# Patient Record
Sex: Female | Born: 1960 | Race: White | Hispanic: No | Marital: Married | Smoking: Never smoker
Health system: Southern US, Community
[De-identification: ages and names within clinical notes are randomized; demographics above are authoritative.]

## PROBLEM LIST (undated history)

## (undated) DIAGNOSIS — T7840XA Allergy, unspecified, initial encounter: Secondary | ICD-10-CM

## (undated) DIAGNOSIS — F419 Anxiety disorder, unspecified: Secondary | ICD-10-CM

## (undated) DIAGNOSIS — G43909 Migraine, unspecified, not intractable, without status migrainosus: Secondary | ICD-10-CM

## (undated) HISTORY — PX: BREAST CYST EXCISION: SHX579

## (undated) HISTORY — PX: BREAST EXCISIONAL BIOPSY: SUR124

## (undated) HISTORY — DX: Anxiety disorder, unspecified: F41.9

## (undated) HISTORY — DX: Migraine, unspecified, not intractable, without status migrainosus: G43.909

## (undated) HISTORY — DX: Allergy, unspecified, initial encounter: T78.40XA

---

## 2000-09-30 HISTORY — PX: ABDOMINAL HYSTERECTOMY: SHX81

## 2008-01-31 HISTORY — PX: CHOLECYSTECTOMY: SHX55

## 2012-09-30 LAB — HM MAMMOGRAPHY: HM MAMMO: NORMAL

## 2012-09-30 LAB — HM PAP SMEAR: HM Pap smear: NORMAL

## 2012-09-30 LAB — HM COLONOSCOPY: HM COLON: NORMAL

## 2012-09-30 LAB — HM DEXA SCAN

## 2014-03-18 ENCOUNTER — Encounter: Payer: Self-pay | Admitting: *Deleted

## 2014-03-18 ENCOUNTER — Telehealth: Payer: Self-pay | Admitting: *Deleted

## 2014-03-18 NOTE — Telephone Encounter (Signed)
Pre-Visit Call:   Reviewed allergies, medications, health history, and health maintenance with patient and made changes as appropriate.   Family medical history not fully known because patient was in foster care.  Father passed last year due to dementia.   [phone] [place] [place] - [address] [facility] [address]  Health maintenance- patient has completed pre-visit form, and states that she will bring this info to office visit.    Flu- declines Td- declines Patient states that she would not like any shots, she passes out.    Concerns: None!  Just wants to get started with a new MD

## 2014-03-19 ENCOUNTER — Ambulatory Visit (INDEPENDENT_AMBULATORY_CARE_PROVIDER_SITE_OTHER): Admitting: Family Medicine

## 2014-03-19 ENCOUNTER — Encounter: Payer: Self-pay | Admitting: Family Medicine

## 2014-03-19 VITALS — BP 130/80 | HR 79 | Temp 98.0°F | Resp 16 | Ht 63.0 in | Wt 220.2 lb

## 2014-03-19 DIAGNOSIS — E559 Vitamin D deficiency, unspecified: Secondary | ICD-10-CM

## 2014-03-19 DIAGNOSIS — E669 Obesity, unspecified: Secondary | ICD-10-CM

## 2014-03-19 DIAGNOSIS — F411 Generalized anxiety disorder: Secondary | ICD-10-CM | POA: Insufficient documentation

## 2014-03-19 NOTE — Assessment & Plan Note (Signed)
New to provider, chronic for pt.  Admits she does not take meds regularly.  Due for repeat labs.  Will replete prn.

## 2014-03-19 NOTE — Assessment & Plan Note (Signed)
New to provider, ongoing for pt.  sxs are well controlled on Lexapro daily.  Will continue to follow and modify meds if needed

## 2014-03-19 NOTE — Assessment & Plan Note (Signed)
New to provider.  Pt is not getting regular exercise nor following any particular diet.  Stressed need for regular exercise and low carb diet.  Encouraged regular, aerobic activity for 30 minutes at least 4x/week and monitoring caloric intake w/ the help of MyFitnessPal app.  Check labs to risk stratify.  Pt expressed understanding and is in agreement w/ plan.

## 2014-03-19 NOTE — Progress Notes (Signed)
   Subjective:    Patient ID: Tiffany Mcgee, female    DOB: [birth year], 54 y.o.   MRN: [id number]  HPI New to establish.  [place]  Anxiety- chronic problem, currently on Lexapro 20 mg daily.  Pt feels anxiety is pretty well controlled.  No longer 'waking up scared to death or going to bed scared to death'.  Hx of panic attacks, has not had one recently.  Vit D deficiency- chronic problem, has prescription for 50,000 units weekly but 'i'm really random about it'.  Due for repeat labs.  Obesity- BMI of 39.  Pt reports weight gain since starting SSRIs.  Not exercising regularly.  Previously was walking 2-3 miles 3-4x/week.  Not following diet plan.  No CP, SOB, HAs, visual changes, abd pain, N/V, edema.   Review of Systems For ROS see HPI     Objective:   Physical Exam  Constitutional: She is oriented to person, place, and time. She appears well-developed and well-nourished. No distress.  obese  HENT:  Head: Normocephalic and atraumatic.  Eyes: Conjunctivae and EOM are normal. Pupils are equal, round, and reactive to light.  Neck: Normal range of motion. Neck supple. No thyromegaly present.  Cardiovascular: Normal rate, regular rhythm, normal heart sounds and intact distal pulses.   No murmur heard. Pulmonary/Chest: Effort normal and breath sounds normal. No respiratory distress.  Abdominal: Soft. She exhibits no distension. There is no tenderness.  Musculoskeletal: She exhibits no edema.  Lymphadenopathy:    She has no cervical adenopathy.  Neurological: She is alert and oriented to person, place, and time.  Skin: Skin is warm and dry.  Psychiatric: She has a normal mood and affect. Her behavior is normal.  Vitals reviewed.         Assessment & Plan:

## 2014-03-19 NOTE — Patient Instructions (Signed)
Schedule your complete physical in 6 months We'll notify you of your lab results and make any changes if needed Try and make healthy food choices and get regular exercise Call with any questions or concerns Welcome!  We're glad to have you!!

## 2014-03-19 NOTE — Progress Notes (Signed)
Pre visit review using our clinic review tool, if applicable. No additional management support is needed unless otherwise documented below in the visit note. 

## 2014-03-20 LAB — CBC WITH DIFFERENTIAL/PLATELET
BASOS ABS: 0.1 10*3/uL (ref 0.0–0.1)
Basophils Relative: 0.7 % (ref 0.0–3.0)
EOS ABS: 0.3 10*3/uL (ref 0.0–0.7)
Eosinophils Relative: 3.7 % (ref 0.0–5.0)
HEMATOCRIT: 41.2 % (ref 36.0–46.0)
Hemoglobin: 13.8 g/dL (ref 12.0–15.0)
LYMPHS ABS: 2.2 10*3/uL (ref 0.7–4.0)
Lymphocytes Relative: 30.6 % (ref 12.0–46.0)
MCHC: 33.7 g/dL (ref 30.0–36.0)
MCV: 90.6 fl (ref 78.0–100.0)
MONO ABS: 0.7 10*3/uL (ref 0.1–1.0)
MONOS PCT: 9.2 % (ref 3.0–12.0)
NEUTROS ABS: 4 10*3/uL (ref 1.4–7.7)
Neutrophils Relative %: 55.8 % (ref 43.0–77.0)
PLATELETS: 269 10*3/uL (ref 150.0–400.0)
RBC: 4.54 Mil/uL (ref 3.87–5.11)
RDW: 12.8 % (ref 11.5–15.5)
WBC: 7.1 10*3/uL (ref 4.0–10.5)

## 2014-03-20 LAB — LIPID PANEL
CHOL/HDL RATIO: 5
Cholesterol: 207 mg/dL — ABNORMAL HIGH (ref 0–200)
HDL: 43.7 mg/dL (ref >39.00)
LDL CALC: 140 mg/dL — AB (ref 0–99)
NONHDL: 163.3
Triglycerides: 119 mg/dL (ref 0.0–149.0)
VLDL: 23.8 mg/dL (ref 0.0–40.0)

## 2014-03-20 LAB — BASIC METABOLIC PANEL
BUN: 15 mg/dL (ref 6–23)
CALCIUM: 9.5 mg/dL (ref 8.4–10.5)
CO2: 27 meq/L (ref 19–32)
Chloride: 101 mEq/L (ref 96–112)
Creatinine, Ser: 0.82 mg/dL (ref 0.40–1.20)
GFR: 77.85 mL/min (ref >60.00)
Glucose, Bld: 82 mg/dL (ref 70–99)
POTASSIUM: 4.2 meq/L (ref 3.5–5.1)
SODIUM: 136 meq/L (ref 135–145)

## 2014-03-20 LAB — HEPATIC FUNCTION PANEL
ALT: 30 U/L (ref 0–35)
AST: 27 U/L (ref 0–37)
Albumin: 3.9 g/dL (ref 3.5–5.2)
Alkaline Phosphatase: 89 U/L (ref 39–117)
BILIRUBIN DIRECT: 0.1 mg/dL (ref 0.0–0.3)
TOTAL PROTEIN: 7.4 g/dL (ref 6.0–8.3)
Total Bilirubin: 0.5 mg/dL (ref 0.2–1.2)

## 2014-03-20 LAB — HEMOGLOBIN A1C: Hgb A1c MFr Bld: 5.8 % (ref 4.6–6.5)

## 2014-03-20 LAB — TSH: TSH: 2.79 u[IU]/mL (ref 0.35–4.50)

## 2014-03-20 LAB — VITAMIN D 25 HYDROXY (VIT D DEFICIENCY, FRACTURES): VITD: 15.77 ng/mL — AB (ref 30.00–100.00)

## 2014-03-23 ENCOUNTER — Other Ambulatory Visit: Payer: Self-pay | Admitting: General Practice

## 2014-03-23 MED ORDER — VITAMIN D (ERGOCALCIFEROL) 1.25 MG (50000 UNIT) PO CAPS: 50000.0000 [IU] | ORAL_CAPSULE | ORAL | Status: DC

## 2014-05-26 ENCOUNTER — Other Ambulatory Visit: Payer: Self-pay | Admitting: Family Medicine

## 2014-05-26 NOTE — Telephone Encounter (Signed)
Med denied, pt needs to continue OTC supplement.

## 2014-09-04 ENCOUNTER — Telehealth: Payer: Self-pay | Admitting: Family Medicine

## 2014-09-04 NOTE — Telephone Encounter (Signed)
Pt rescheduled from 09/28/14 8:00am cpe to 10/01/14 11:00am, preapproved per Jess T.

## 2014-09-04 NOTE — Telephone Encounter (Signed)
Noted  

## 2014-09-16 ENCOUNTER — Telehealth: Payer: Self-pay | Admitting: Family Medicine

## 2014-09-16 NOTE — Telephone Encounter (Signed)
pre visit letter mailed 09/10/14 °

## 2014-09-28 ENCOUNTER — Encounter: Admitting: Family Medicine

## 2014-09-30 ENCOUNTER — Telehealth: Payer: Self-pay | Admitting: *Deleted

## 2014-09-30 ENCOUNTER — Encounter: Payer: Self-pay | Admitting: *Deleted

## 2014-09-30 NOTE — Telephone Encounter (Signed)
Pre-Visit Call completed with patient and chart updated.   Pre-Visit Info documented in Specialty Comments under [employer].    

## 2014-10-01 ENCOUNTER — Ambulatory Visit (INDEPENDENT_AMBULATORY_CARE_PROVIDER_SITE_OTHER): Admitting: Family Medicine

## 2014-10-01 ENCOUNTER — Encounter: Payer: Self-pay | Admitting: Family Medicine

## 2014-10-01 VITALS — BP 126/80 | HR 83 | Temp 98.2°F | Resp 18 | Ht 63.5 in | Wt 219.8 lb

## 2014-10-01 DIAGNOSIS — E669 Obesity, unspecified: Secondary | ICD-10-CM

## 2014-10-01 DIAGNOSIS — Z0000 Encounter for general adult medical examination without abnormal findings: Secondary | ICD-10-CM | POA: Diagnosis not present

## 2014-10-01 DIAGNOSIS — Z1231 Encounter for screening mammogram for malignant neoplasm of breast: Secondary | ICD-10-CM

## 2014-10-01 LAB — CBC WITH DIFFERENTIAL/PLATELET
BASOS ABS: 0 10*3/uL (ref 0.0–0.1)
Basophils Relative: 0.5 % (ref 0.0–3.0)
EOS ABS: 0.2 10*3/uL (ref 0.0–0.7)
Eosinophils Relative: 3.9 % (ref 0.0–5.0)
HEMATOCRIT: 42.7 % (ref 36.0–46.0)
HEMOGLOBIN: 14.2 g/dL (ref 12.0–15.0)
LYMPHS PCT: 27.5 % (ref 12.0–46.0)
Lymphs Abs: 1.7 10*3/uL (ref 0.7–4.0)
MCHC: 33.4 g/dL (ref 30.0–36.0)
MCV: 92.3 fl (ref 78.0–100.0)
MONO ABS: 0.5 10*3/uL (ref 0.1–1.0)
Monocytes Relative: 7.5 % (ref 3.0–12.0)
NEUTROS ABS: 3.8 10*3/uL (ref 1.4–7.7)
Neutrophils Relative %: 60.6 % (ref 43.0–77.0)
PLATELETS: 296 10*3/uL (ref 150.0–400.0)
RBC: 4.62 Mil/uL (ref 3.87–5.11)
RDW: 13.2 % (ref 11.5–15.5)
WBC: 6.3 10*3/uL (ref 4.0–10.5)

## 2014-10-01 LAB — BASIC METABOLIC PANEL
BUN: 17 mg/dL (ref 6–23)
CHLORIDE: 103 meq/L (ref 96–112)
CO2: 30 mEq/L (ref 19–32)
CREATININE: 0.83 mg/dL (ref 0.40–1.20)
Calcium: 9.6 mg/dL (ref 8.4–10.5)
GFR: 76.61 mL/min (ref >60.00)
Glucose, Bld: 97 mg/dL (ref 70–99)
POTASSIUM: 4.7 meq/L (ref 3.5–5.1)
Sodium: 139 mEq/L (ref 135–145)

## 2014-10-01 LAB — LIPID PANEL
CHOL/HDL RATIO: 4
Cholesterol: 188 mg/dL (ref 0–200)
HDL: 42.7 mg/dL (ref >39.00)
LDL Cholesterol: 128 mg/dL — ABNORMAL HIGH (ref 0–99)
NONHDL: 145.16
Triglycerides: 87 mg/dL (ref 0.0–149.0)
VLDL: 17.4 mg/dL (ref 0.0–40.0)

## 2014-10-01 LAB — HEPATIC FUNCTION PANEL
ALT: 19 U/L (ref 0–35)
AST: 20 U/L (ref 0–37)
Albumin: 4.1 g/dL (ref 3.5–5.2)
Alkaline Phosphatase: 73 U/L (ref 39–117)
Bilirubin, Direct: 0.1 mg/dL (ref 0.0–0.3)
TOTAL PROTEIN: 7.8 g/dL (ref 6.0–8.3)
Total Bilirubin: 0.5 mg/dL (ref 0.2–1.2)

## 2014-10-01 LAB — TSH: TSH: 1.72 u[IU]/mL (ref 0.35–4.50)

## 2014-10-01 LAB — VITAMIN D 25 HYDROXY (VIT D DEFICIENCY, FRACTURES): VITD: 44.02 ng/mL (ref 30.00–100.00)

## 2014-10-01 NOTE — Progress Notes (Signed)
   Subjective:    Patient ID: Tiffany Mcgee, female    DOB: 07/08/1960, 54 y.o.   MRN: [id number]  HPI CPE- UTD on pap, due next year.  Due for mammo.  UTD on colonoscopy- due 2024.     Review of Systems Patient reports no vision/ hearing changes, adenopathy,fever, weight change,  persistant/recurrent hoarseness , swallowing issues, chest pain, palpitations, edema, persistant/recurrent cough, hemoptysis, dyspnea (rest/exertional/paroxysmal nocturnal), gastrointestinal bleeding (melena, rectal bleeding), abdominal pain, significant heartburn, bowel changes, GU symptoms (dysuria, hematuria, incontinence), Gyn symptoms (abnormal  bleeding, pain),  syncope, focal weakness, memory loss, numbness & tingling, skin/hair/nail changes, abnormal bruising or bleeding, anxiety, or depression.    + obesity- has decided she needs to lose weight and has lost 3 lbs since Saturday.  Pt is walking regularly.    Objective:   Physical Exam General Appearance:    Alert, cooperative, no distress, appears stated age  Head:    Normocephalic, without obvious abnormality, atraumatic  Eyes:    PERRL, conjunctiva/corneas clear, EOM's intact, fundi    benign, both eyes  Ears:    Normal TM's and external ear canals, both ears  Nose:   Nares normal, septum midline, mucosa normal, no drainage    or sinus tenderness  Throat:   Lips, mucosa, and tongue normal; teeth and gums normal  Neck:   Supple, symmetrical, trachea midline, no adenopathy;    Thyroid: no enlargement/tenderness/nodules  Back:     Symmetric, no curvature, ROM normal, no CVA tenderness  Lungs:     Clear to auscultation bilaterally, respirations unlabored  Chest Wall:    No tenderness or deformity   Heart:    Regular rate and rhythm, S1 and S2 normal, no murmur, rub   or gallop  Breast Exam:    Normal breast exam  Abdomen:     Soft, non-tender, bowel sounds active all four quadrants,    no masses, no organomegaly  Genitalia:    Deferred  Rectal:      Extremities:   Extremities normal, atraumatic, no cyanosis or edema  Pulses:   2+ and symmetric all extremities  Skin:   Skin color, texture, turgor normal, no rashes or lesions  Lymph nodes:   Cervical, supraclavicular, and axillary nodes normal  Neurologic:   CNII-XII intact, normal strength, sensation and reflexes    throughout          Assessment & Plan:

## 2014-10-01 NOTE — Patient Instructions (Signed)
Follow up in 6-8 weeks to recheck weight loss progress We'll notify you of your lab results and make any changes if needed Keep up the good work on healthy diet and regular exercise- you can totally do this! If you need help in your weight loss journey- call [facility] at [phone] We'll call you with your mammogram appt Resume your Calcium + Vit D daily Add daily Claritin, Allegra, Zyrtec for the allergy component Call with any questions or concerns Happy Labor Day!!!

## 2014-10-01 NOTE — Progress Notes (Signed)
Pre visit review using our clinic review tool, if applicable. No additional management support is needed unless otherwise documented below in the visit note. 

## 2014-10-01 NOTE — Assessment & Plan Note (Signed)
Chronic problem for pt.  She reports that she has committed to making changes in diet and exercise.  Discussed medically supervised weight loss through [facility].  Applauded her decision to make changes and will follow.

## 2014-10-01 NOTE — Assessment & Plan Note (Signed)
Pt's PE WNL w/ exception of obesity.  Due for mammo- order entered.  UTD on pap and colonoscopy.  Check labs.  Anticipatory guidance provided.

## 2014-10-09 ENCOUNTER — Ambulatory Visit (HOSPITAL_BASED_OUTPATIENT_CLINIC_OR_DEPARTMENT_OTHER)
Admission: RE | Admit: 2014-10-09 | Discharge: 2014-10-09 | Disposition: A | Discharge status: Home / Self Care | Source: Ambulatory Visit | Attending: Family Medicine | Admitting: Family Medicine

## 2014-10-09 DIAGNOSIS — Z1231 Encounter for screening mammogram for malignant neoplasm of breast: Secondary | ICD-10-CM | POA: Diagnosis not present

## 2014-10-09 DIAGNOSIS — R928 Other abnormal and inconclusive findings on diagnostic imaging of breast: Secondary | ICD-10-CM | POA: Diagnosis not present

## 2014-10-20 ENCOUNTER — Other Ambulatory Visit: Payer: Self-pay | Admitting: Family Medicine

## 2014-10-20 DIAGNOSIS — R928 Other abnormal and inconclusive findings on diagnostic imaging of breast: Secondary | ICD-10-CM

## 2014-10-26 ENCOUNTER — Encounter

## 2014-10-26 ENCOUNTER — Other Ambulatory Visit

## 2014-10-26 ENCOUNTER — Ambulatory Visit
Admission: RE | Admit: 2014-10-26 | Discharge: 2014-10-26 | Disposition: A | Discharge status: Home / Self Care | Source: Ambulatory Visit | Attending: Family Medicine | Admitting: Family Medicine

## 2014-10-26 DIAGNOSIS — R928 Other abnormal and inconclusive findings on diagnostic imaging of breast: Secondary | ICD-10-CM

## 2014-10-29 ENCOUNTER — Other Ambulatory Visit: Payer: Self-pay | Admitting: Family Medicine

## 2014-10-29 DIAGNOSIS — N63 Unspecified lump in unspecified breast: Secondary | ICD-10-CM

## 2014-11-23 ENCOUNTER — Ambulatory Visit: Admitting: Family Medicine

## 2014-11-23 ENCOUNTER — Telehealth: Payer: Self-pay | Admitting: Family Medicine

## 2014-11-24 NOTE — Telephone Encounter (Signed)
Pt was no show 11/23/14 9:30am for follow up appt, pt has not rescheduled, charge or no charge?

## 2014-11-24 NOTE — Telephone Encounter (Signed)
No fee as I was not in office on 10/24

## 2015-04-26 ENCOUNTER — Ambulatory Visit
Admission: RE | Admit: 2015-04-26 | Discharge: 2015-04-26 | Disposition: A | Discharge status: Home / Self Care | Source: Ambulatory Visit | Attending: Family Medicine | Admitting: Family Medicine

## 2015-04-26 DIAGNOSIS — N63 Unspecified lump in unspecified breast: Secondary | ICD-10-CM

## 2015-11-03 ENCOUNTER — Other Ambulatory Visit: Payer: Self-pay | Admitting: Family Medicine

## 2015-11-03 ENCOUNTER — Other Ambulatory Visit: Payer: Self-pay | Admitting: Internal Medicine

## 2015-11-03 DIAGNOSIS — N6002 Solitary cyst of left breast: Secondary | ICD-10-CM

## 2015-11-09 ENCOUNTER — Ambulatory Visit
Admission: RE | Admit: 2015-11-09 | Discharge: 2015-11-09 | Disposition: A | Discharge status: Home / Self Care | Source: Ambulatory Visit | Attending: Internal Medicine | Admitting: Internal Medicine

## 2015-11-09 DIAGNOSIS — N6002 Solitary cyst of left breast: Secondary | ICD-10-CM

## 2016-10-11 ENCOUNTER — Other Ambulatory Visit: Payer: Self-pay | Admitting: Internal Medicine

## 2016-10-11 DIAGNOSIS — N6320 Unspecified lump in the left breast, unspecified quadrant: Secondary | ICD-10-CM

## 2016-11-10 ENCOUNTER — Encounter

## 2016-11-10 ENCOUNTER — Other Ambulatory Visit

## 2016-11-17 ENCOUNTER — Ambulatory Visit

## 2016-11-17 ENCOUNTER — Ambulatory Visit
Admission: RE | Admit: 2016-11-17 | Discharge: 2016-11-17 | Disposition: A | Discharge status: Home / Self Care | Source: Ambulatory Visit | Attending: Internal Medicine | Admitting: Internal Medicine

## 2016-11-17 DIAGNOSIS — N6320 Unspecified lump in the left breast, unspecified quadrant: Secondary | ICD-10-CM

## 2017-01-08 IMAGING — MG 2D DIGITAL DIAGNOSTIC BILATERAL MAMMOGRAM WITH CAD AND ADJUNCT T
8 of 12 series · 8 of 28 positions shown · non-contrast
Comparison: Previous exam(s).

CLINICAL DATA: Followup for probably benign left breast mass.

EXAM:
2D DIGITAL DIAGNOSTIC BILATERAL MAMMOGRAM WITH CAD AND ADJUNCT TOMO
LEFT BREAST ULTRASOUND

[R MLO synth-2D]
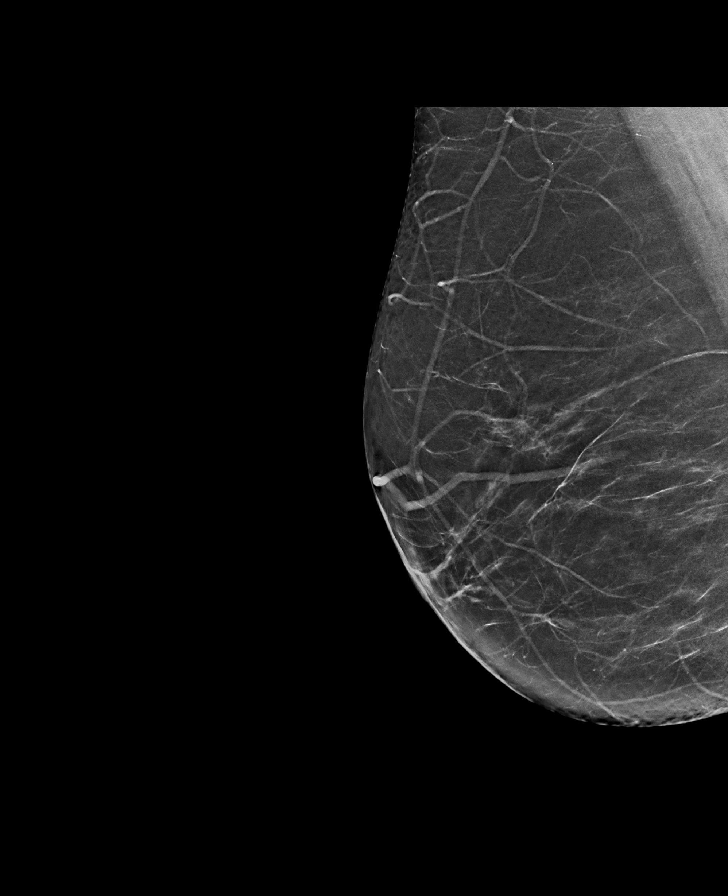

[L CC]
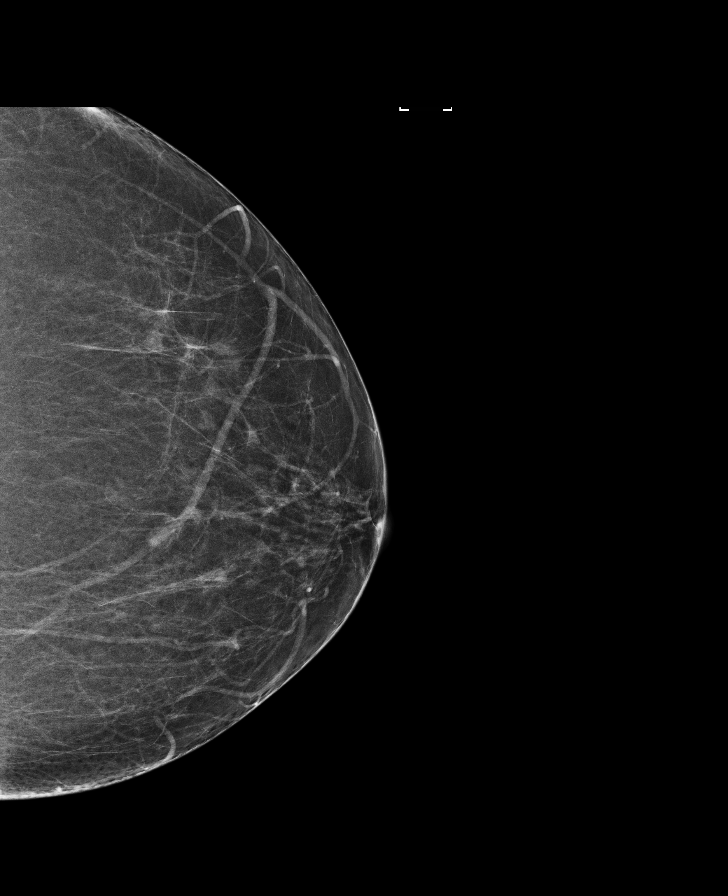

[L CC synth-2D]
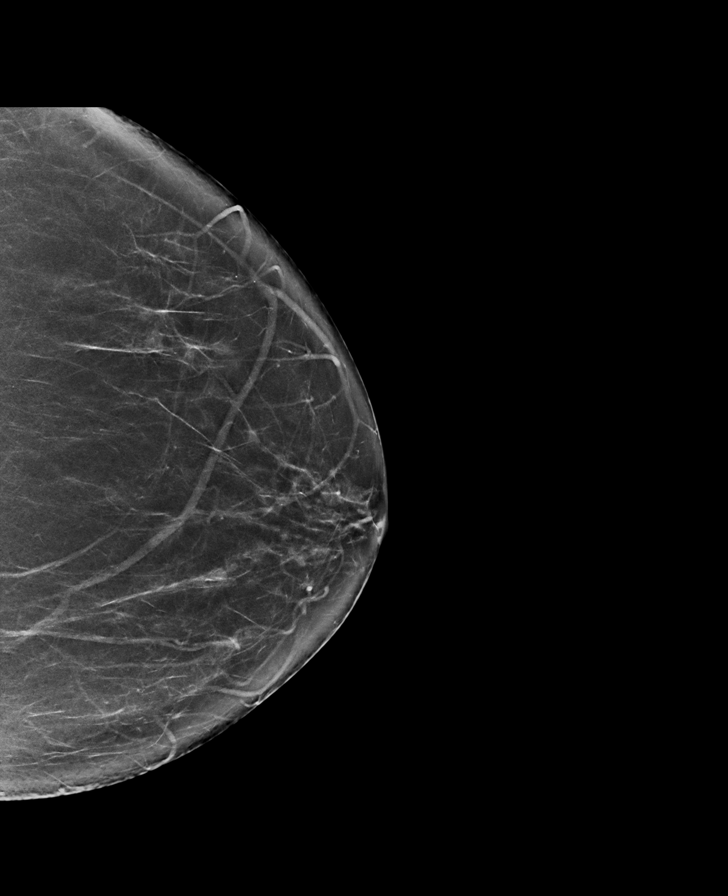

[L MLO synth-2D]
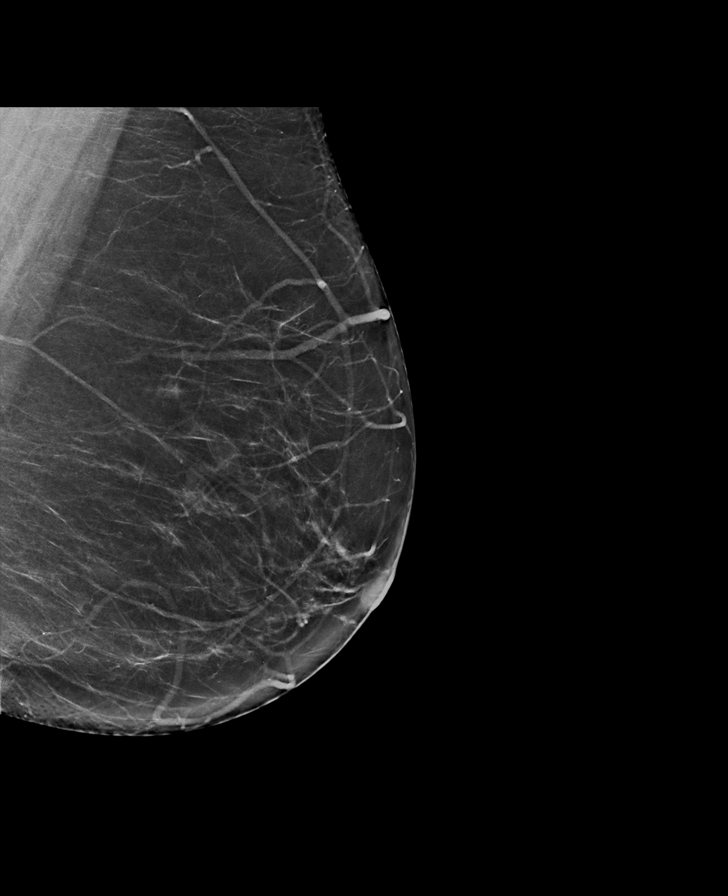

[R CC synth-2D]
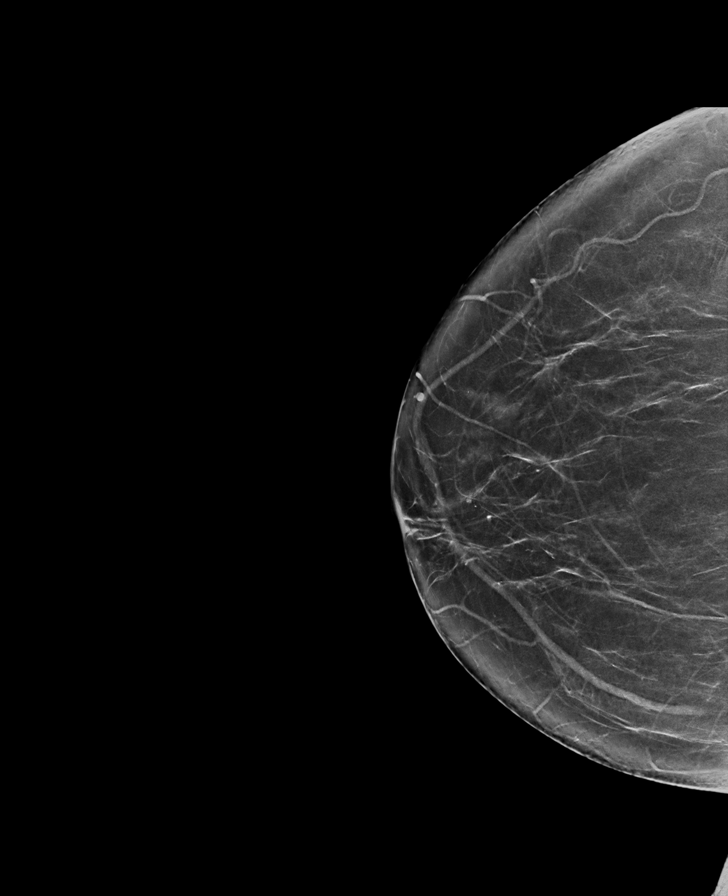

[R MLO]
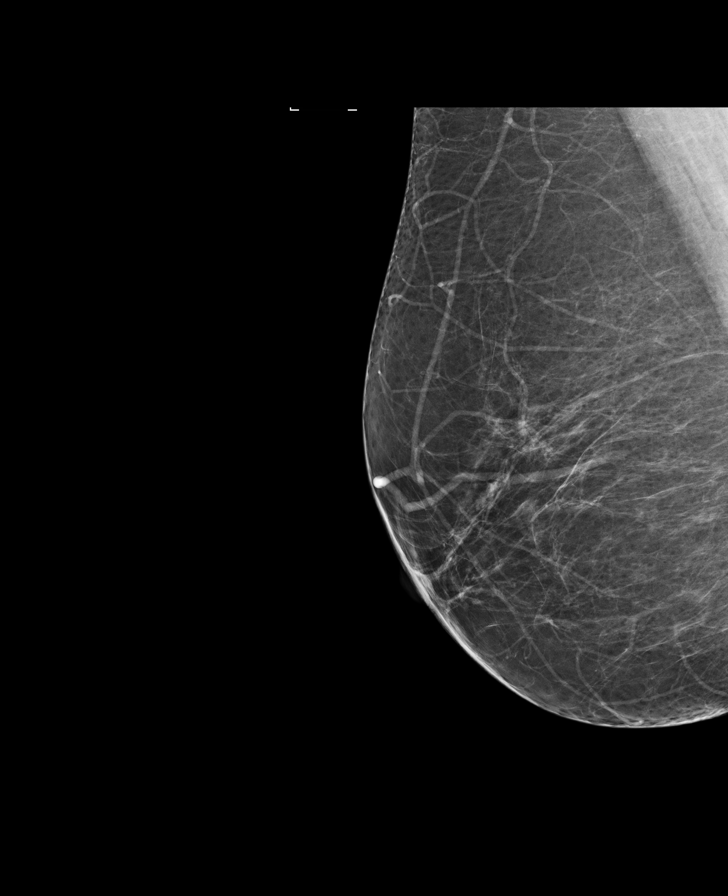

[L MLO]
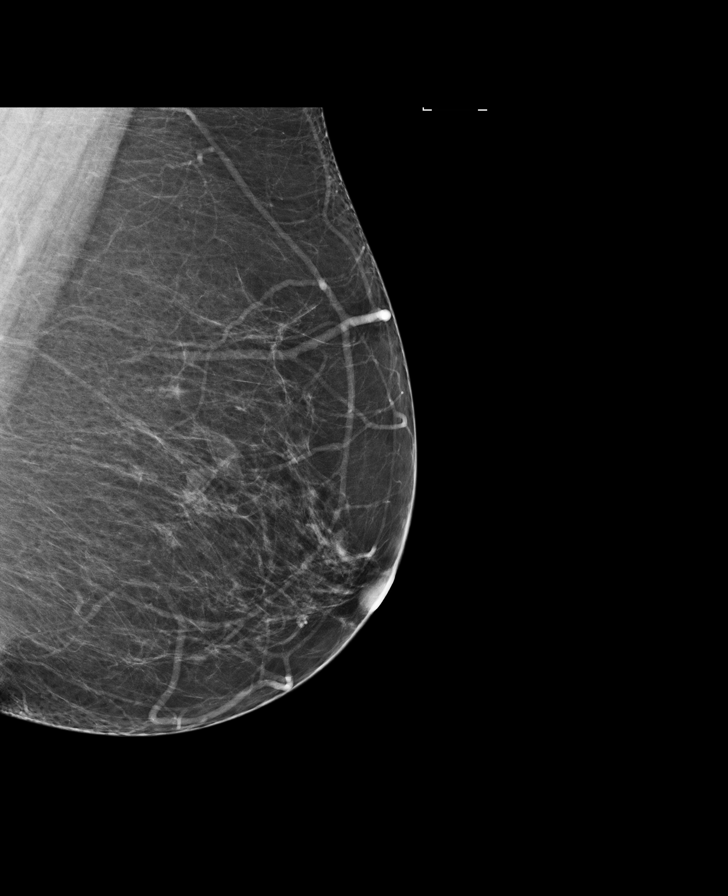

[R CC]
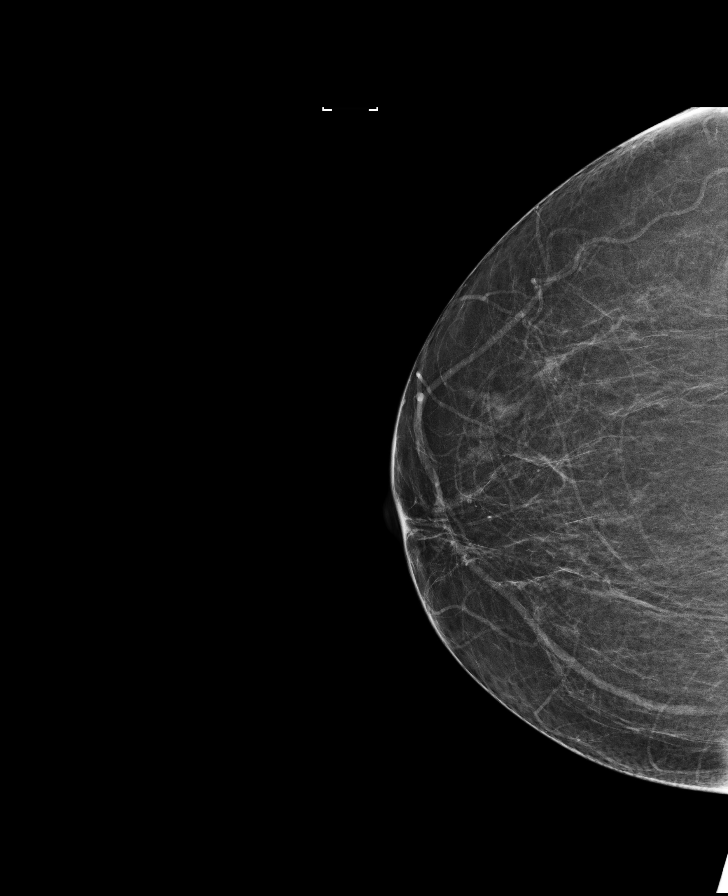

[8 of 28 positions shown; findings below may reference images not displayed]

ACR Breast Density Category b: There are scattered areas of
fibroglandular density.
FINDINGS: No suspicious masses or calcifications are seen in either breast.
The oval circumscribed mass in the central to slightly inner left
breast overall appears unchanged. There is no mammographic evidence
of malignancy in the left breast.

Mammographic images were processed with CAD.

Targeted ultrasound of the left breast was performed demonstrating
an oval well-circumscribed near anechoic mass with posterior
acoustic enhancement consistent with a cyst at [DATE] 4 cm from
nipple measuring 0.6 x 0.3 x 0.6 cm. This is overall unchanged in
size in appearance when compared to the prior exam given differences
in imaging and measurement technique. This likely corresponds with
the mass seen at mammography.
IMPRESSION: Unchanged benign appearing mass in the upper inner left breast.
There is no mammographic evidence of malignancy in either breast.

RECOMMENDATION:
Bilateral diagnostic mammography in 12 months with left breast
ultrasound which will demonstrate 2 years of stability of the
probably benign of left breast mass.

I have discussed the findings and recommendations with the patient.
Results were also provided in writing at the conclusion of the
visit. If applicable, a reminder letter will be sent to the patient
regarding the next appointment.

BI-RADS CATEGORY  3: Probably benign.

## 2018-01-17 IMAGING — MG 2D DIGITAL DIAGNOSTIC BILATERAL MAMMOGRAM WITH CAD AND ADJUNCT T
3 series · 4 of 7 positions shown · non-contrast
Comparison: Previous exam(s).

CLINICAL DATA: Two year follow-up of probably benign left breast
mass.

EXAM:
2D DIGITAL DIAGNOSTIC BILATERAL MAMMOGRAM WITH CAD AND ADJUNCT TOMO

[L MLO synth-2D]
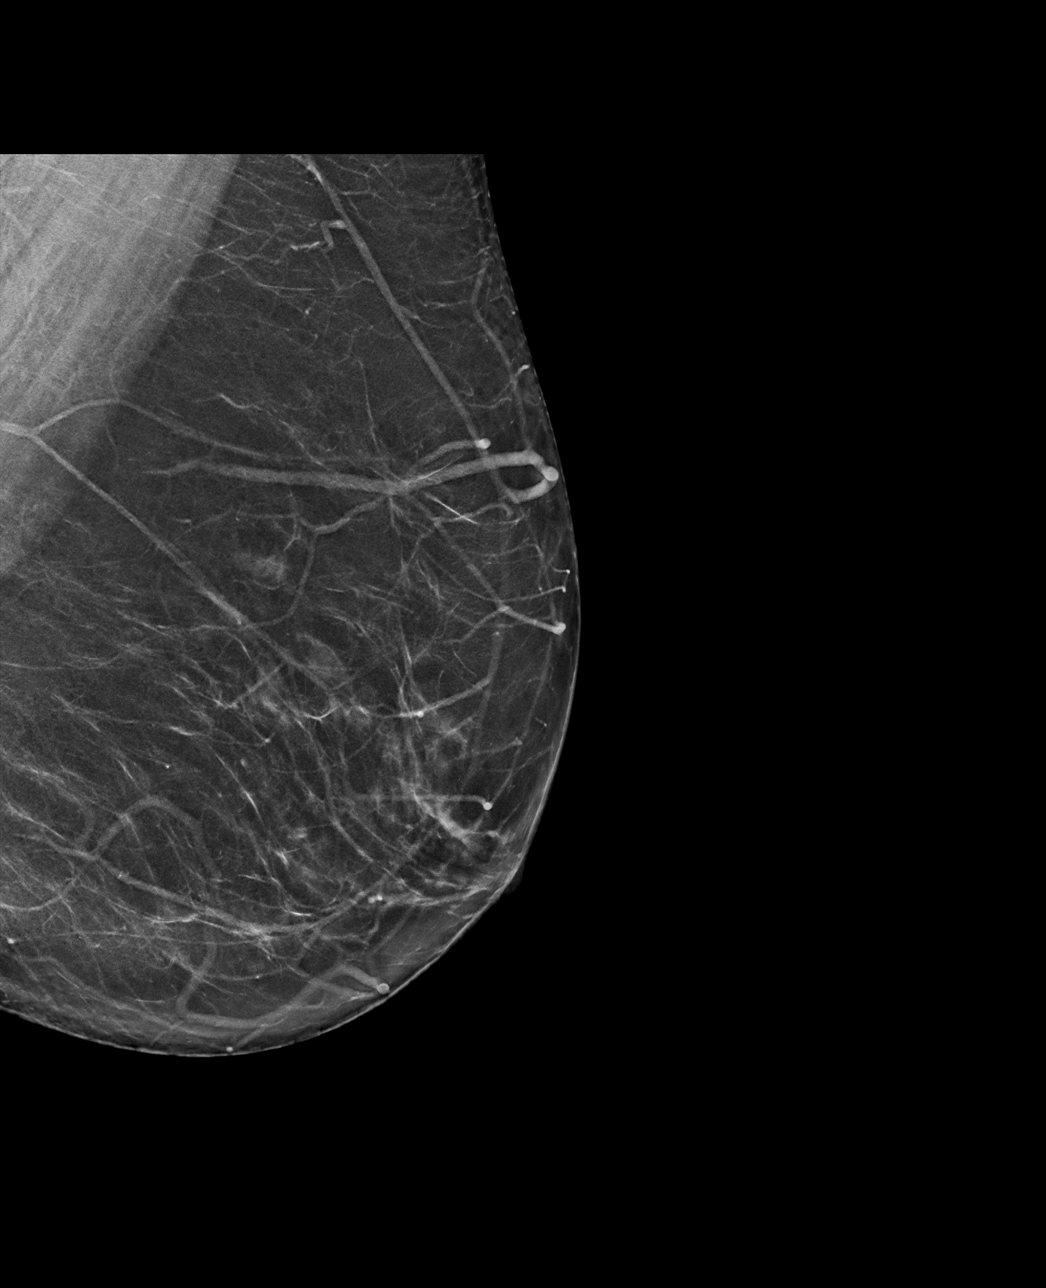

[R CC synth-2D]
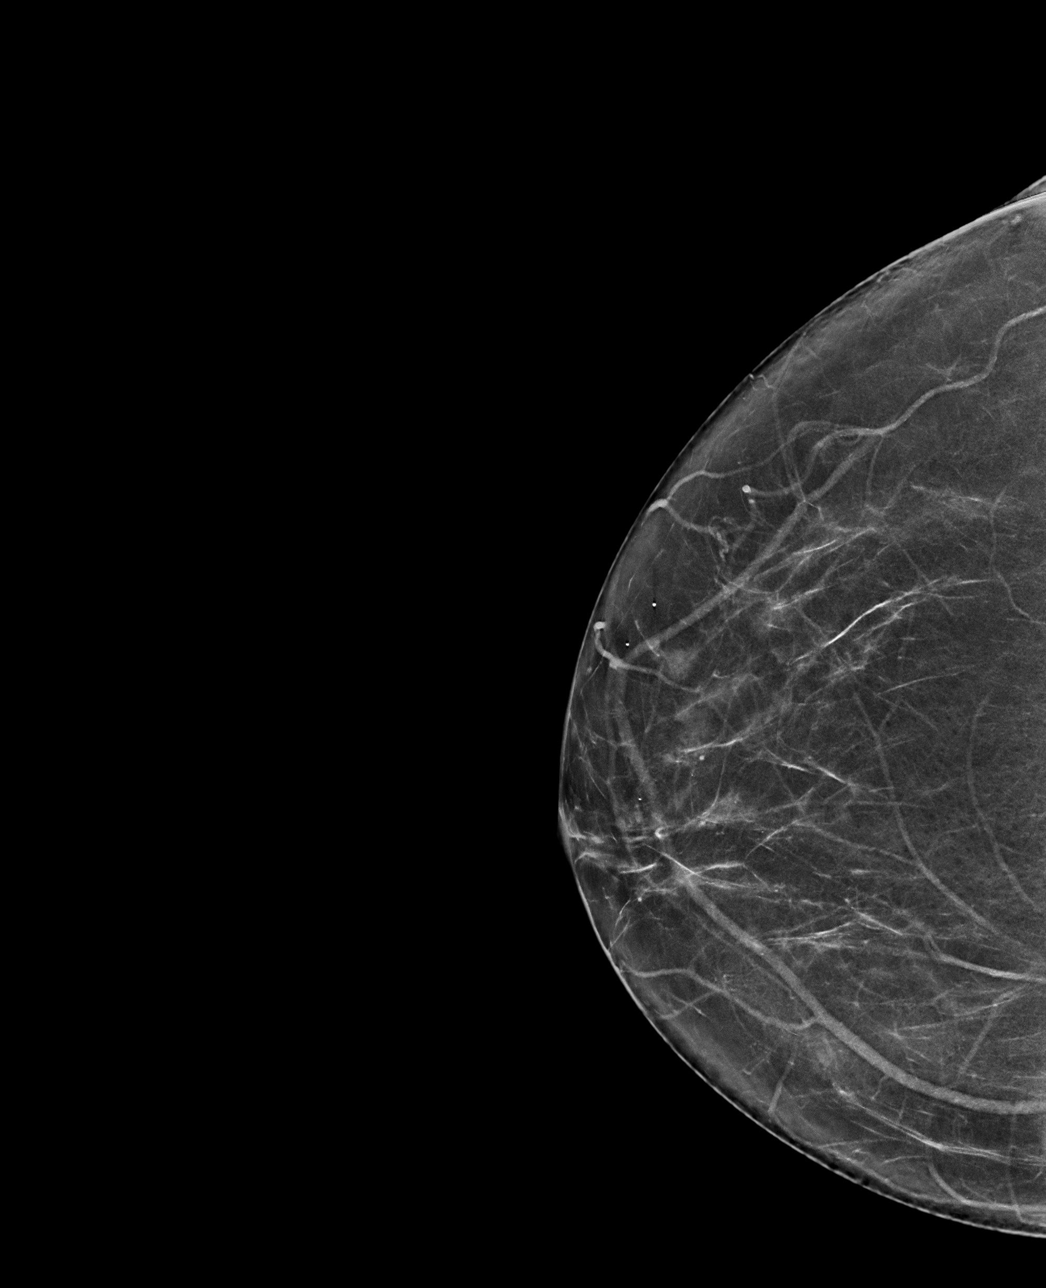

[R CC tomo · 2 of 70 frames shown]
[frame 23/70]
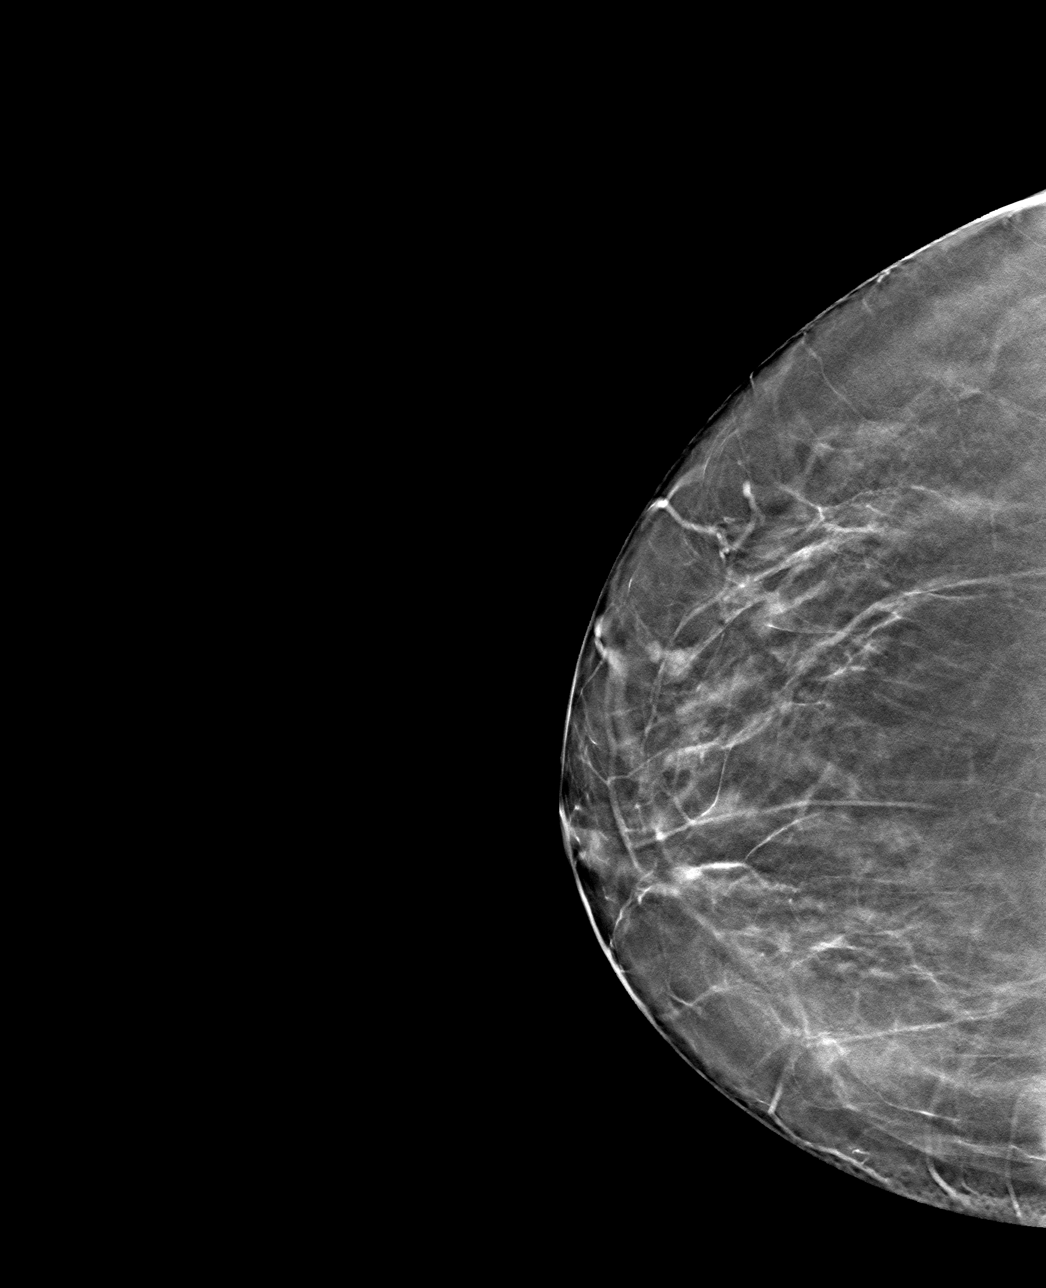
[frame 35/70]
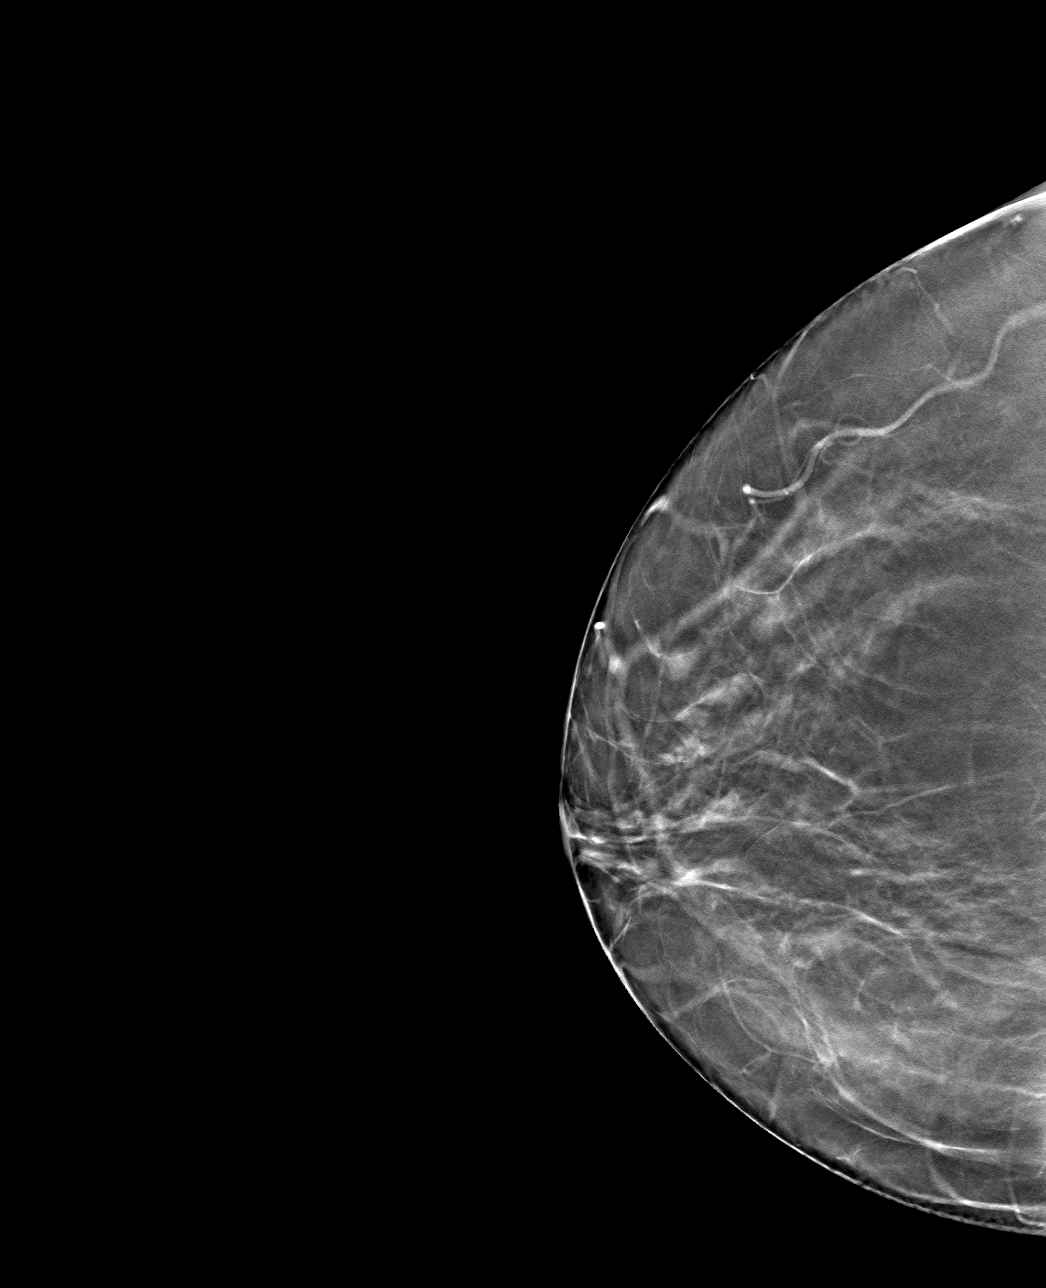

[4 of 7 positions shown; findings below may reference images not displayed]

ACR Breast Density Category b: There are scattered areas of
fibroglandular density.
FINDINGS: Mammographically, there are no suspicious masses, areas of
nonsurgical architectural distortion, or microcalcifications in
either breast. Stable postsurgical changes benign excisional biopsy
are seen in the right 12 o'clock breast. There is also a stable
low-density benign-appearing nodule in the upper inner left breast,
middle depth.

Mammographic images were processed with CAD.
IMPRESSION: Stable benign-appearing less than a cm left breast nodule.

No mammographic evidence of malignancy in either breast.

RECOMMENDATION:
Screening mammogram in one year.(Code:KF-Y-87R)

I have discussed the findings and recommendations with the patient.
Results were also provided in writing at the conclusion of the
visit. If applicable, a reminder letter will be sent to the patient
regarding the next appointment.

BI-RADS CATEGORY  2: Benign.
